# Patient Record
Sex: Female | Born: 1975 | Race: White | Hispanic: No | Marital: Married | Smoking: Former smoker
Health system: Southern US, Community
[De-identification: ages and names within clinical notes are randomized; demographics above are authoritative.]

---

## 2004-12-05 ENCOUNTER — Emergency Department (HOSPITAL_COMMUNITY): Admission: EM | Admit: 2004-12-05 | Discharge: 2004-12-05 | Payer: Self-pay | Admitting: Emergency Medicine

## 2005-04-21 ENCOUNTER — Inpatient Hospital Stay (HOSPITAL_COMMUNITY): Admission: AD | Admit: 2005-04-21 | Discharge: 2005-04-23 | Payer: Self-pay | Admitting: Obstetrics and Gynecology

## 2006-10-27 ENCOUNTER — Ambulatory Visit (HOSPITAL_COMMUNITY): Admission: RE | Admit: 2006-10-27 | Discharge: 2006-10-27 | Payer: Self-pay | Admitting: Obstetrics and Gynecology

## 2006-12-01 ENCOUNTER — Ambulatory Visit (HOSPITAL_COMMUNITY): Admission: RE | Admit: 2006-12-01 | Discharge: 2006-12-01 | Payer: Self-pay | Admitting: Obstetrics and Gynecology

## 2012-01-02 ENCOUNTER — Ambulatory Visit: Payer: Self-pay | Admitting: Internal Medicine

## 2012-01-02 VITALS — BP 108/78 | HR 71 | Temp 98.4°F | Resp 16 | Ht 60.75 in | Wt 213.4 lb

## 2012-01-02 DIAGNOSIS — E669 Obesity, unspecified: Secondary | ICD-10-CM | POA: Insufficient documentation

## 2012-01-02 DIAGNOSIS — L0390 Cellulitis, unspecified: Secondary | ICD-10-CM

## 2012-01-02 MED ORDER — TETANUS-DIPHTH-ACELL PERTUSSIS 5-2.5-18.5 LF-MCG/0.5 IM SUSP
0.5000 mL | Freq: Once | INTRAMUSCULAR | Status: AC
  Administered 2012-01-02: 0.5 mL via INTRAMUSCULAR

## 2012-01-02 MED ORDER — DOXYCYCLINE HYCLATE 100 MG PO TABS: 100.0000 mg | ORAL_TABLET | Freq: Two times a day (BID) | ORAL | Status: AC

## 2012-01-02 NOTE — Patient Instructions (Signed)

## 2012-01-02 NOTE — Progress Notes (Signed)
  Subjective:    Patient ID: Marisa Harrison, female    DOB: [birth year], 36 y.o.   MRN: [id number]  HPI  Marisa Harrison comes in today as a new patient with complaints of red streaking up her left arm.  She noticed two insect bites on her lower forearm on THursday, and she has been applying topical cortisone cream to them.  Yesterday and today her left arm has been more red and swollen.  She has been visiting her mother in ICU at [facility] this week.  She is unsure of her last tetnus shot.    Review of Systems  All other systems reviewed and are negative.       Objective:   Physical Exam  Vitals reviewed. Constitutional: She is oriented to person, place, and time. She appears well-developed and well-nourished.  HENT:  Head: Normocephalic.  Mouth/Throat: Oropharynx is clear and moist. No oropharyngeal exudate.  Neck: Neck supple.  Cardiovascular: Normal rate, regular rhythm and normal heart sounds.   Pulmonary/Chest: Effort normal and breath sounds normal.  Lymphadenopathy:    She has no cervical adenopathy.  Neurological: She is alert and oriented to person, place, and time.  Skin: There is erythema.       Left lower forearm with redness, swelling, tenderness around central small papular lesion c/w cellulitis  small lymph node palpated just above red area on left arm.        Assessment & Plan:  Cellulitis:  Doxycycline 100 mg BID for 10 days.  Tdap updated today.  Return if not improving.

## 2015-07-08 ENCOUNTER — Encounter: Payer: Self-pay | Admitting: Women's Health

## 2015-07-08 ENCOUNTER — Ambulatory Visit (INDEPENDENT_AMBULATORY_CARE_PROVIDER_SITE_OTHER): Payer: BLUE CROSS/BLUE SHIELD | Admitting: Women's Health

## 2015-07-08 VITALS — BP 122/74 | HR 108 | Ht 60.75 in | Wt 206.0 lb

## 2015-07-08 DIAGNOSIS — Z3202 Encounter for pregnancy test, result negative: Secondary | ICD-10-CM

## 2015-07-08 DIAGNOSIS — Z30430 Encounter for insertion of intrauterine contraceptive device: Secondary | ICD-10-CM | POA: Insufficient documentation

## 2015-07-08 DIAGNOSIS — Z30014 Encounter for initial prescription of intrauterine contraceptive device: Secondary | ICD-10-CM

## 2015-07-08 DIAGNOSIS — Z30433 Encounter for removal and reinsertion of intrauterine contraceptive device: Secondary | ICD-10-CM

## 2015-07-08 NOTE — Progress Notes (Signed)
Patient ID: Marisa Harrison, female   DOB: 20-Oct-1975, 39 y.o.   MRN: [id number] [name] is a 39 y.o. year old Caucasian female who presents for removal and replacement of a Mirena IUD. She was given informed consent for removal and reinsertion of her Mirena. Her Mirena was placed 2011, No LMP recorded. Patient is not currently having periods (Reason: IUD)., and her pregnancy test today was neg.   The risks and benefits of the method and placement have been thouroughly reviewed with the patient and all questions were answered.  Specifically the patient is aware of failure rate of 09/998, expulsion of the IUD and of possible perforation.  The patient is aware of irregular bleeding due to the method and understands the incidence of irregular bleeding diminishes with time.  Signed copy of informed consent in chart.   No LMP recorded. Patient is not currently having periods (Reason: IUD). BP 122/74 mmHg  Pulse 108  Ht 5' 0.75" (1.543 m)  Wt 206 lb (93.441 kg)  BMI 39.25 kg/m2 No results found for this or any previous visit (from the past 24 hour(s)).   Appropriate time out taken. A graves speculum was placed in the vagina.  The cervix was visualized, prepped using Betadine. The strings were visible. They were grasped and the Mirena was easily removed. The cervix was then grasped with a single-tooth tenaculum. The uterus was found to be anteroflexed and it sounded to 7 cm.  Mirena IUD placed per manufacturer's recommendations without complications. The strings were trimmed to 3 cm.  The patient tolerated the procedure well.   Sonogram was performed and the proper placement of the IUD was verified via transvaginal u/s.   The patient was given post procedure instructions, including signs and symptoms of infection and to check for the strings after each menses or each month, and refraining from intercourse or anything in the vagina for 3 days.  She was given a Mirnea care card with date IUD  placed, and date IUD to be removed.  She is scheduled for a f/u appointment in 4 weeks for pap & physical  Marge Duncans CNM, [facility] 07/08/2015 2:04 PM

## 2015-07-08 NOTE — Patient Instructions (Signed)
 Nothing in vagina for 3 days (no sex, douching, tampons, etc...)  Check your strings once a month to make sure you can feel them, if you are not able to please let us know  If you develop a fever of 100.4 or more in the next few weeks, or if you develop severe abdominal pain, please let us know  Use a backup method of birth control, such as condoms, for 2 weeks   Levonorgestrel intrauterine device (IUD) What is this medicine? LEVONORGESTREL IUD (LEE voe nor jes trel) is a contraceptive (birth control) device. The device is placed inside the uterus by a healthcare professional. It is used to prevent pregnancy and can also be used to treat heavy bleeding that occurs during your period. Depending on the device, it can be used for 3 to 5 years. This medicine may be used for other purposes; ask your health care provider or pharmacist if you have questions. What should I tell my health care provider before I take this medicine? They need to know if you have any of these conditions: -abnormal Pap smear -cancer of the breast, uterus, or cervix -diabetes -endometritis -genital or pelvic infection now or in the past -have more than one sexual partner or your partner has more than one partner -heart disease -history of an ectopic or tubal pregnancy -immune system problems -IUD in place -liver disease or tumor -problems with blood clots or take blood-thinners -use intravenous drugs -uterus of unusual shape -vaginal bleeding that has not been explained -an unusual or allergic reaction to levonorgestrel, other hormones, silicone, or polyethylene, medicines, foods, dyes, or preservatives -pregnant or trying to get pregnant -breast-feeding How should I use this medicine? This device is placed inside the uterus by a health care professional. Talk to your pediatrician regarding the use of this medicine in children. Special care may be needed. Overdosage: If you think you have taken too much of  this medicine contact a poison control center or emergency room at once. NOTE: This medicine is only for you. Do not share this medicine with others. What if I miss a dose? This does not apply. What may interact with this medicine? Do not take this medicine with any of the following medications: -amprenavir -bosentan -fosamprenavir This medicine may also interact with the following medications: -aprepitant -barbiturate medicines for inducing sleep or treating seizures -bexarotene -griseofulvin -medicines to treat seizures like carbamazepine, ethotoin, felbamate, oxcarbazepine, phenytoin, topiramate -modafinil -pioglitazone -rifabutin -rifampin -rifapentine -some medicines to treat HIV infection like atazanavir, indinavir, lopinavir, nelfinavir, tipranavir, ritonavir -St. John's wort -warfarin This list may not describe all possible interactions. Give your health care provider a list of all the medicines, herbs, non-prescription drugs, or dietary supplements you use. Also tell them if you smoke, drink alcohol, or use illegal drugs. Some items may interact with your medicine. What should I watch for while using this medicine? Visit your doctor or health care professional for regular check ups. See your doctor if you or your partner has sexual contact with others, becomes HIV positive, or gets a sexual transmitted disease. This product does not protect you against HIV infection (AIDS) or other sexually transmitted diseases. You can check the placement of the IUD yourself by reaching up to the top of your vagina with clean fingers to feel the threads. Do not pull on the threads. It is a good habit to check placement after each menstrual period. Call your doctor right away if you feel more of the IUD than just   the threads or if you cannot feel the threads at all. The IUD may come out by itself. You may become pregnant if the device comes out. If you notice that the IUD has come out use a  backup birth control method like condoms and call your health care provider. Using tampons will not change the position of the IUD and are okay to use during your period. What side effects may I notice from receiving this medicine? Side effects that you should report to your doctor or health care professional as soon as possible: -allergic reactions like skin rash, itching or hives, swelling of the face, lips, or tongue -fever, flu-like symptoms -genital sores -high blood pressure -no menstrual period for 6 weeks during use -pain, swelling, warmth in the leg -pelvic pain or tenderness -severe or sudden headache -signs of pregnancy -stomach cramping -sudden shortness of breath -trouble with balance, talking, or walking -unusual vaginal bleeding, discharge -yellowing of the eyes or skin Side effects that usually do not require medical attention (report to your doctor or health care professional if they continue or are bothersome): -acne -breast pain -change in sex drive or performance -changes in weight -cramping, dizziness, or faintness while the device is being inserted -headache -irregular menstrual bleeding within first 3 to 6 months of use -nausea This list may not describe all possible side effects. Call your doctor for medical advice about side effects. You may report side effects to FDA at 1-800-FDA-1088. Where should I keep my medicine? This does not apply. NOTE: This sheet is a summary. It may not cover all possible information. If you have questions about this medicine, talk to your doctor, pharmacist, or health care provider.    2016, Elsevier/Gold Standard. (2011-10-15 13:54:04)  

## 2015-08-06 ENCOUNTER — Other Ambulatory Visit: Payer: BLUE CROSS/BLUE SHIELD | Admitting: Women's Health

## 2015-08-12 ENCOUNTER — Other Ambulatory Visit: Payer: BLUE CROSS/BLUE SHIELD | Admitting: Women's Health

## 2017-04-02 ENCOUNTER — Other Ambulatory Visit: Payer: Self-pay | Admitting: Family Medicine

## 2017-04-02 DIAGNOSIS — Z1231 Encounter for screening mammogram for malignant neoplasm of breast: Secondary | ICD-10-CM

## 2017-04-09 ENCOUNTER — Ambulatory Visit: Payer: Self-pay

## 2017-05-10 ENCOUNTER — Ambulatory Visit: Payer: Self-pay

## 2017-05-11 ENCOUNTER — Ambulatory Visit: Payer: Self-pay

## 2017-05-27 ENCOUNTER — Ambulatory Visit
Admission: RE | Admit: 2017-05-27 | Discharge: 2017-05-27 | Disposition: A | Discharge status: Home / Self Care | Payer: Commercial Managed Care - PPO | Source: Ambulatory Visit | Attending: Family Medicine | Admitting: Family Medicine

## 2017-05-27 DIAGNOSIS — Z1231 Encounter for screening mammogram for malignant neoplasm of breast: Secondary | ICD-10-CM

## 2018-06-08 ENCOUNTER — Other Ambulatory Visit: Payer: Self-pay | Admitting: Family Medicine

## 2018-06-08 DIAGNOSIS — Z1231 Encounter for screening mammogram for malignant neoplasm of breast: Secondary | ICD-10-CM

## 2018-07-06 ENCOUNTER — Ambulatory Visit: Payer: Commercial Managed Care - PPO

## 2020-07-04 ENCOUNTER — Other Ambulatory Visit: Payer: Self-pay

## 2020-07-04 ENCOUNTER — Ambulatory Visit (INDEPENDENT_AMBULATORY_CARE_PROVIDER_SITE_OTHER): Payer: Commercial Managed Care - PPO | Admitting: Advanced Practice Midwife

## 2020-07-04 ENCOUNTER — Encounter: Payer: Self-pay | Admitting: Advanced Practice Midwife

## 2020-07-04 VITALS — BP 113/78 | HR 101 | Ht 61.0 in | Wt 172.0 lb

## 2020-07-04 DIAGNOSIS — Z30432 Encounter for removal of intrauterine contraceptive device: Secondary | ICD-10-CM

## 2020-07-04 MED ORDER — SULFAMETHOXAZOLE-TRIMETHOPRIM 800-160 MG PO TABS: 1.0000 | ORAL_TABLET | Freq: Two times a day (BID) | ORAL | 0 refills | Status: DC

## 2020-07-04 NOTE — Patient Instructions (Addendum)
Skin Abscess  A skin abscess is an infected area on or under your skin that contains a collection of pus and other material. An abscess may also be called a furuncle, carbuncle, or boil. An abscess can occur in or on almost any part of your body. Some abscesses break open (rupture) on their own. Most continue to get worse unless they are treated. The infection can spread deeper into the body and eventually into your blood, which can make you feel ill. Treatment usually involves draining the abscess. What are the causes? An abscess occurs when germs, like bacteria, pass through your skin and cause an infection. This may be caused by:  A scrape or cut on your skin.  A puncture wound through your skin, including a needle injection or insect bite.  Blocked oil or sweat glands.  Blocked and infected hair follicles.  A cyst that forms beneath your skin (sebaceous cyst) and becomes infected. What increases the risk? This condition is more likely to develop in people who:  Have a weak body defense system (immune system).  Have diabetes.  Have dry and irritated skin.  Get frequent injections or use illegal IV drugs.  Have a foreign body in a wound, such as a splinter.  Have problems with their lymph system or veins. What are the signs or symptoms? Symptoms of this condition include:  A painful, firm bump under the skin.  A bump with pus at the top. This may break through the skin and drain. Other symptoms include:  Redness surrounding the abscess site.  Warmth.  Swelling of the lymph nodes (glands) near the abscess.  Tenderness.  A sore on the skin. How is this diagnosed? This condition may be diagnosed based on:  A physical exam.  Your medical history.  A sample of pus. This may be used to find out what is causing the infection.  Blood tests.  Imaging tests, such as an ultrasound, CT scan, or MRI. How is this treated? A small abscess that drains on its own may  not need treatment. Treatment for larger abscesses may include:  Moist heat or heat pack applied to the area several times a day.  A procedure to drain the abscess (incision and drainage).  Antibiotic medicines. For a severe abscess, you may first get antibiotics through an IV and then change to antibiotics by mouth. Follow these instructions at home: Medicines   Take over-the-counter and prescription medicines only as told by your health care provider.  If you were prescribed an antibiotic medicine, take it as told by your health care provider. Do not stop taking the antibiotic even if you start to feel better. Abscess care   If you have an abscess that has not drained, apply heat to the affected area. Use the heat source that your health care provider recommends, such as a moist heat pack or a heating pad. ? Place a towel between your skin and the heat source. ? Leave the heat on for 20-30 minutes. ? Remove the heat if your skin turns bright red. This is especially important if you are unable to feel pain, heat, or cold. You may have a greater risk of getting burned.  Follow instructions from your health care provider about how to take care of your abscess. Make sure you: ? Cover the abscess with a bandage (dressing). ? Change your dressing or gauze as told by your health care provider. ? Wash your hands with soap and water before you change the   dressing or gauze. If soap and water are not available, use hand sanitizer.  Check your abscess every day for signs of a worsening infection. Check for: ? More redness, swelling, or pain. ? More fluid or blood. ? Warmth. ? More pus or a bad smell. General instructions  To avoid spreading the infection: ? Do not share personal care items, towels, or hot tubs with others. ? Avoid making skin contact with other people.  Keep all follow-up visits as told by your health care provider. This is important. Contact a health care provider if  you have:  More redness, swelling, or pain around your abscess.  More fluid or blood coming from your abscess.  Warm skin around your abscess.  More pus or a bad smell coming from your abscess.  A fever.  Muscle aches.  Chills or a general ill feeling. Get help right away if you:  Have severe pain.  See red streaks on your skin spreading away from the abscess. Summary  A skin abscess is an infected area on or under your skin that contains a collection of pus and other material.  A small abscess that drains on its own may not need treatment.  Treatment for larger abscesses may include having a procedure to drain the abscess and taking an antibiotic. This information is not intended to replace advice given to you by your health care provider. Make sure you discuss any questions you have with your health care provider. Document Revised: 01/05/2019 Document Reviewed: 10/28/2017 Elsevier Patient Education  2020 Elsevier Inc.  

## 2020-07-04 NOTE — Progress Notes (Signed)
  Marisa Harrison 44 y.o.  Vitals:   07/04/20 1506  BP: 113/78  Pulse: (!) 101   No past medical history on file. No past surgical history on file. family history includes Cancer in her maternal grandfather; Hypertension in her father; Liver disease in her mother; Stroke in her paternal grandmother. No current outpatient medications on file.    Here for IUD removal.  She had the Mirena IUD placed 5 years ago and would like it removed .Marland Kitchen Her plans for future contraception are VASECTOMY (already done) Also, has a boil on groin area for about a month.  Gets smaller/bigger.  Underwear irritates it. .  A graves speculum was placed, and the strings were visible.  They were grasped with a curved Tresa Endo and the IUD easily removed.   Rx septra DS BID x10

## 2020-07-25 ENCOUNTER — Other Ambulatory Visit: Payer: Self-pay | Admitting: Advanced Practice Midwife

## 2020-12-18 ENCOUNTER — Ambulatory Visit
Admission: RE | Admit: 2020-12-18 | Discharge: 2020-12-18 | Disposition: A | Discharge status: Home / Self Care | Payer: Commercial Managed Care - PPO | Source: Ambulatory Visit | Attending: Family Medicine | Admitting: Family Medicine

## 2020-12-18 ENCOUNTER — Other Ambulatory Visit: Payer: Self-pay

## 2020-12-18 ENCOUNTER — Other Ambulatory Visit: Payer: Self-pay | Admitting: Family Medicine

## 2020-12-18 DIAGNOSIS — Z1231 Encounter for screening mammogram for malignant neoplasm of breast: Secondary | ICD-10-CM

## 2021-01-02 LAB — CYTOLOGY - PAP

## 2021-10-27 ENCOUNTER — Other Ambulatory Visit: Payer: Self-pay

## 2021-10-27 ENCOUNTER — Ambulatory Visit (INDEPENDENT_AMBULATORY_CARE_PROVIDER_SITE_OTHER): Payer: Commercial Managed Care - PPO | Admitting: Adult Health

## 2021-10-27 ENCOUNTER — Encounter: Payer: Self-pay | Admitting: Adult Health

## 2021-10-27 VITALS — BP 109/75 | HR 97 | Ht 62.0 in | Wt 176.0 lb

## 2021-10-27 DIAGNOSIS — Z780 Asymptomatic menopausal state: Secondary | ICD-10-CM | POA: Diagnosis not present

## 2021-10-27 DIAGNOSIS — Z79890 Hormone replacement therapy (postmenopausal): Secondary | ICD-10-CM

## 2021-10-27 MED ORDER — ESTRADIOL 0.5 MG PO TABS: 0.5000 mg | ORAL_TABLET | Freq: Every day | ORAL | 6 refills | Status: DC

## 2021-10-27 MED ORDER — PROGESTERONE 200 MG PO CAPS: ORAL_CAPSULE | ORAL | 6 refills | Status: DC

## 2021-10-27 NOTE — Progress Notes (Signed)
°  Subjective:     Patient ID: Marisa Harrison, female   DOB: 12-09-1975, 46 y.o.   MRN: [id number]  HPI Marisa Harrison is a 46 year year old white female, married, PM, in to discuss HRT,has been on cream. Has never had stroke, MI,DVT, or breast cancer. Had pap 12/2020 was normal with PCP PCP is Bing Matter PA.   Review of Systems Has been on cream HRT for about 9 weeks and hot flashes, vaginal dryness and sleep disturbance better but still anxious and can't lose weight Denies any vaginal bleeding  Reviewed past medical,surgical, social and family history. Reviewed medications and allergies.     Objective:   Physical Exam BP 109/75 (BP Location: Left Arm, Patient Position: Sitting, Cuff Size: Normal)    Pulse 97    Ht 5\' 2"  (1.575 m)    Wt 176 lb (79.8 kg)    BMI 32.19 kg/m     Skin warm and dry. Lungs: clear to ausculation bilaterally. Cardiovascular: regular rate and rhythm.  [place] testosterone 45, FSH 64, estradiol 46, progesterone 0.8 0n 10/20/21. Fall risk is moderate  Upstream - 10/27/21 1410       Pregnancy Intention Screening   Does the patient want to become pregnant in the next year? No    Does the patient's partner want to become pregnant in the next year? No    Would the patient like to discuss contraceptive options today? No      Contraception Wrap Up   Current Method No Method - Other Reason   postmenopausal   End Method No Method - Other Reason   postmenopausal   Contraception Counseling Provided No             Assessment:     1. Hormone replacement therapy (HRT) Will rx estrace 0.5 mg 1 daily po  and Prometrium 200 mg 1 daily at bedtime Meds ordered this encounter  Medications   progesterone (PROMETRIUM) 200 MG capsule    Sig: Take 1 at bedtime daily    Dispense:  30 capsule    Refill:  6    Order Specific Question:   Supervising Provider    Answer:   Tania Ade H [[id number]]   estradiol (ESTRACE) 0.5 MG tablet    Sig: Take 1  tablet (0.5 mg total) by mouth daily.    Dispense:  30 tablet    Refill:  6    Order Specific Question:   Supervising Provider    Answer:   Florian Buff [[id number]]    Send Mychart message in about 8 weeks on how it is going  2. Postmenopause    Plan:     Follow up in 6 months or sooner if needed for ROS and refills

## 2021-12-24 ENCOUNTER — Other Ambulatory Visit: Payer: Self-pay | Admitting: Adult Health

## 2021-12-24 MED ORDER — ESTRADIOL 1 MG PO TABS: 1.0000 mg | ORAL_TABLET | Freq: Every day | ORAL | 3 refills | Status: DC

## 2021-12-24 NOTE — Progress Notes (Signed)
Will increase estrace to 1 mg ?

## 2022-01-30 ENCOUNTER — Other Ambulatory Visit: Payer: Self-pay | Admitting: Family Medicine

## 2022-01-30 DIAGNOSIS — Z1231 Encounter for screening mammogram for malignant neoplasm of breast: Secondary | ICD-10-CM

## 2022-02-06 ENCOUNTER — Ambulatory Visit
Admission: RE | Admit: 2022-02-06 | Discharge: 2022-02-06 | Disposition: A | Discharge status: Home / Self Care | Payer: Commercial Managed Care - PPO | Source: Ambulatory Visit | Attending: Family Medicine | Admitting: Family Medicine

## 2022-02-06 DIAGNOSIS — Z1231 Encounter for screening mammogram for malignant neoplasm of breast: Secondary | ICD-10-CM

## 2022-02-13 ENCOUNTER — Other Ambulatory Visit: Payer: Self-pay | Admitting: *Deleted

## 2022-02-13 MED ORDER — PROGESTERONE 200 MG PO CAPS: ORAL_CAPSULE | ORAL | 3 refills | Status: DC

## 2022-02-13 MED ORDER — ESTRADIOL 1 MG PO TABS: 1.0000 mg | ORAL_TABLET | Freq: Every day | ORAL | 3 refills | Status: DC

## 2022-05-29 ENCOUNTER — Telehealth: Payer: Self-pay | Admitting: Adult Health

## 2022-05-29 MED ORDER — ESTRADIOL 2 MG PO TABS: 2.0000 mg | ORAL_TABLET | Freq: Every day | ORAL | 3 refills | Status: DC

## 2022-05-29 NOTE — Telephone Encounter (Signed)
Glad that estrace 2 mg is working, will send in Rx [facility]

## 2022-06-02 ENCOUNTER — Other Ambulatory Visit: Payer: Self-pay | Admitting: Adult Health

## 2022-06-02 MED ORDER — BUSPIRONE HCL 5 MG PO TABS: 5.0000 mg | ORAL_TABLET | Freq: Three times a day (TID) | ORAL | 2 refills | Status: DC

## 2022-06-02 NOTE — Progress Notes (Signed)
Will rx buspar

## 2022-11-03 IMAGING — MG MM DIGITAL SCREENING BILAT W/ TOMO AND CAD
8 series · 8 of 24 positions shown · non-contrast
Comparison: Previous exam(s).

CLINICAL DATA: Screening.

EXAM:
DIGITAL SCREENING BILATERAL MAMMOGRAM WITH TOMOSYNTHESIS AND CAD
TECHNIQUE: Bilateral screening digital craniocaudal and mediolateral oblique
mammograms were obtained. Bilateral screening digital breast
tomosynthesis was performed. The images were evaluated with
computer-aided detection.

[R MLO synth-2D]
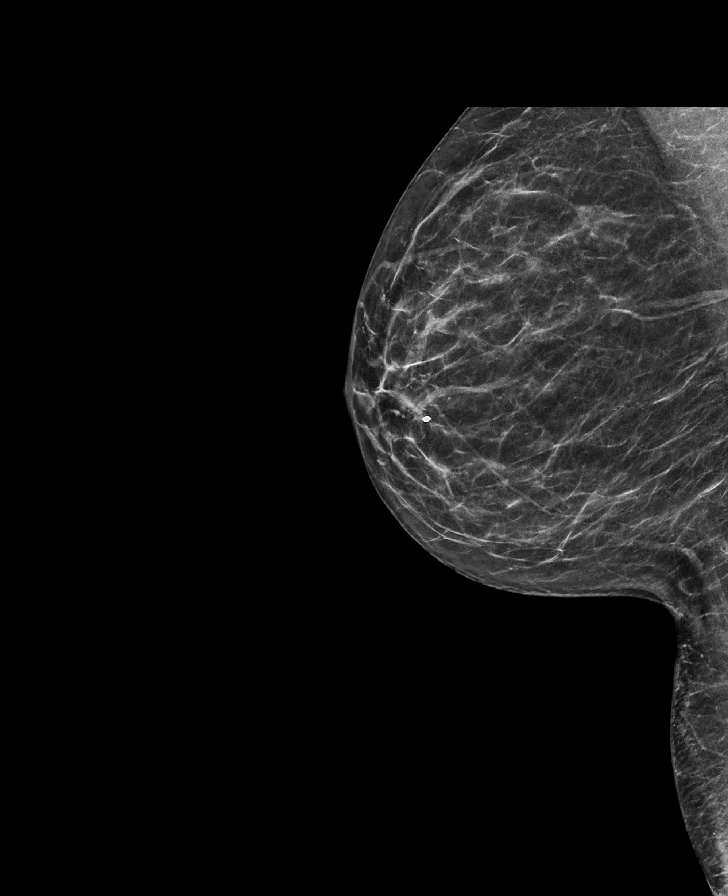

[L CC synth-2D]
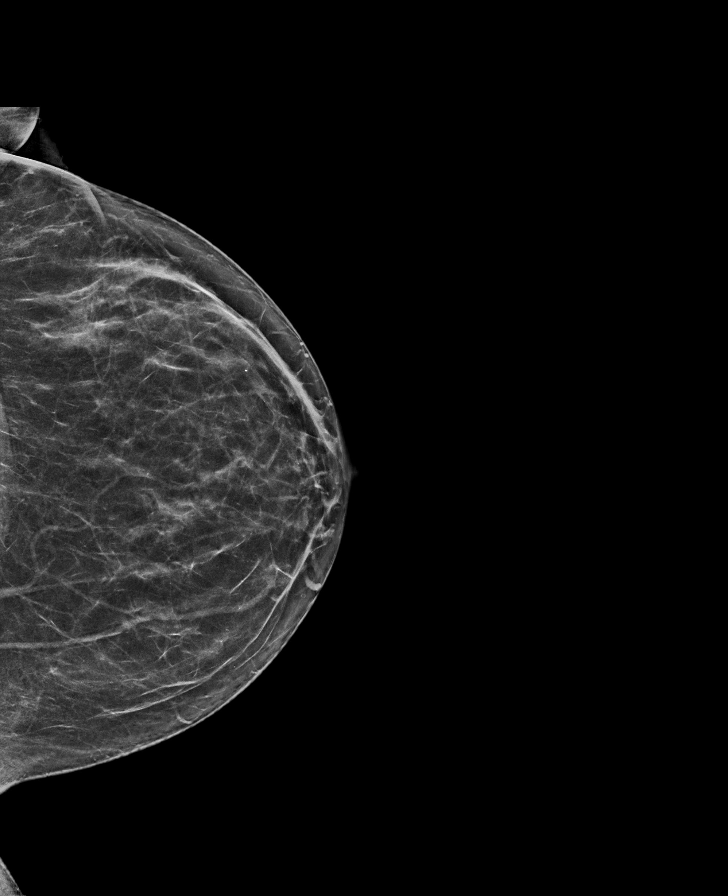

[L MLO synth-2D]
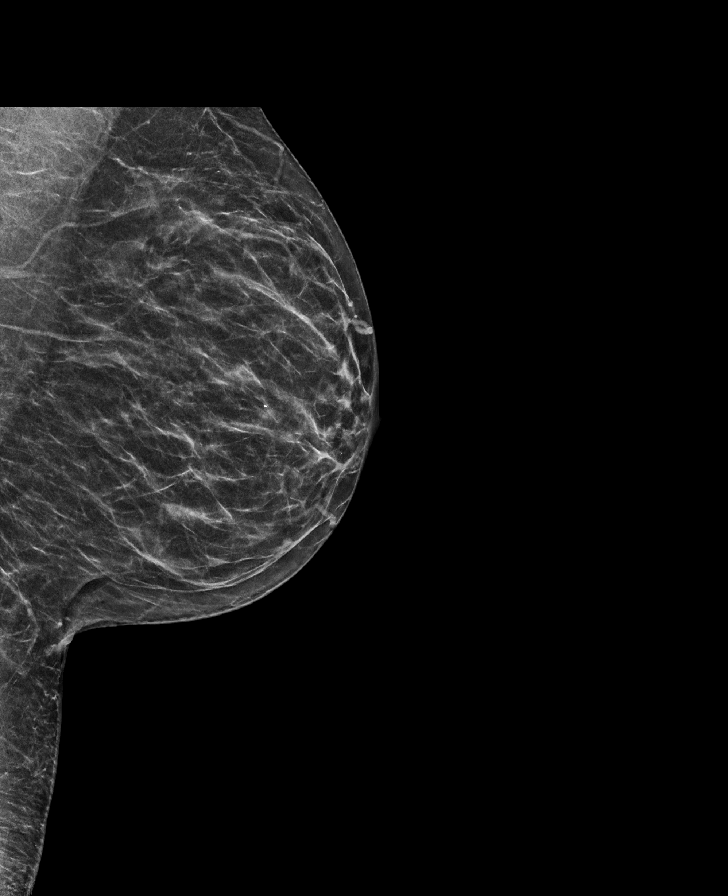

[R CC synth-2D]
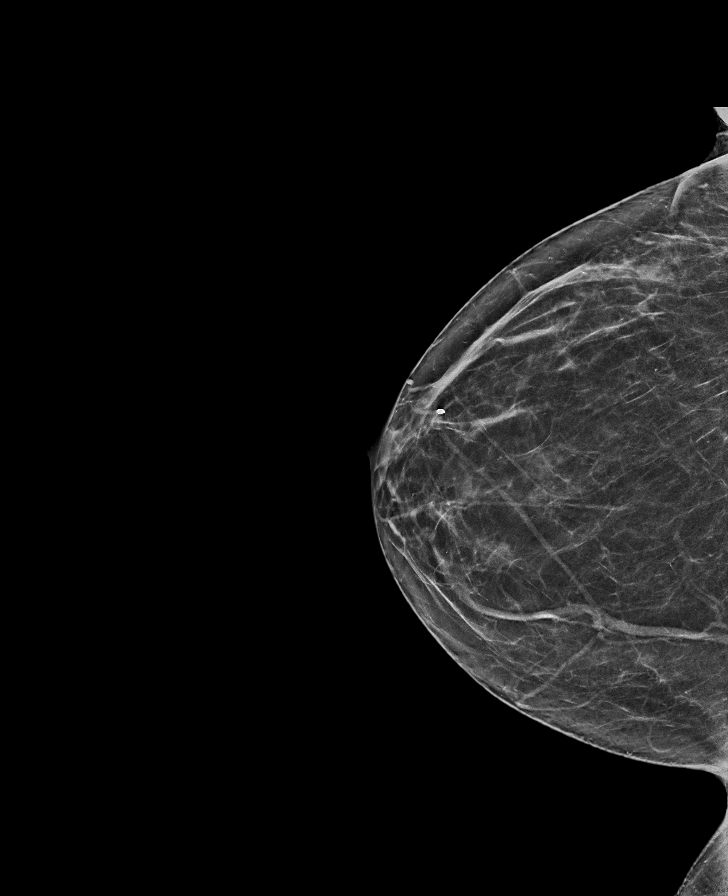

[R MLO tomo · tomo slice 30/59.0]
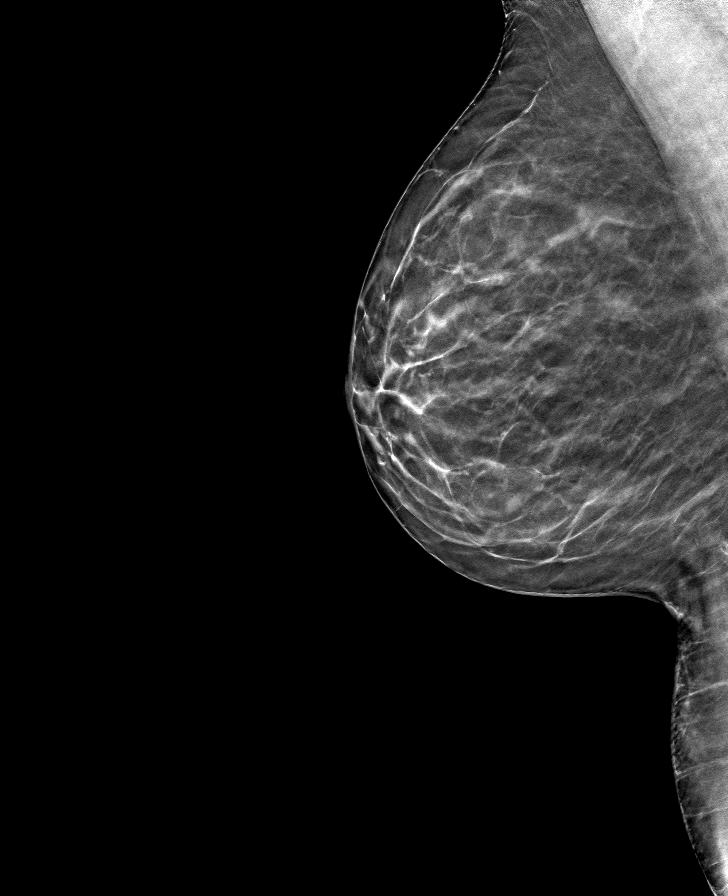

[L MLO tomo · tomo slice 29/58.0]
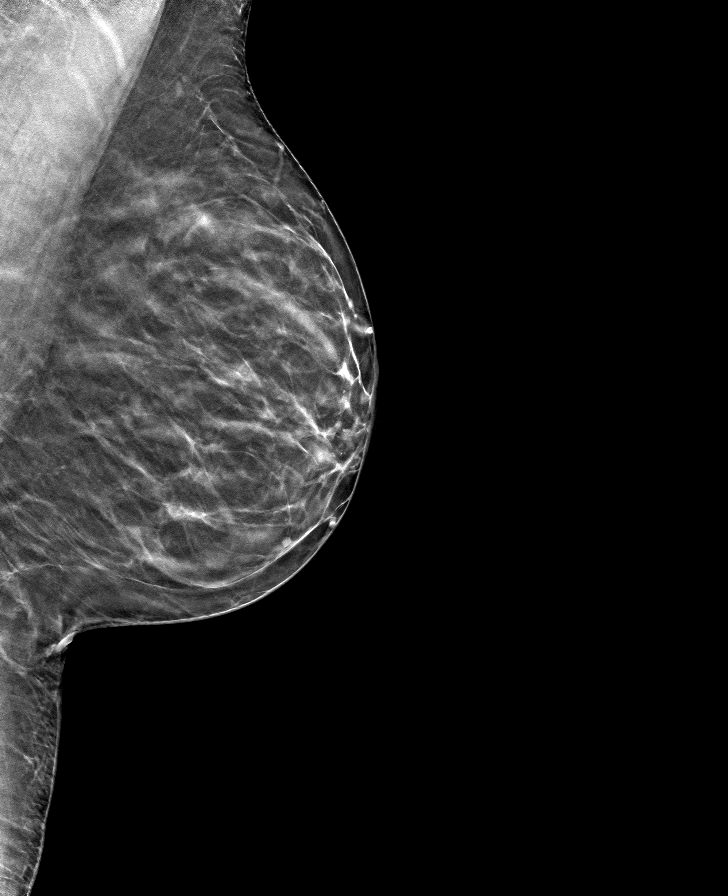

[L CC tomo · tomo slice 32/63.0]
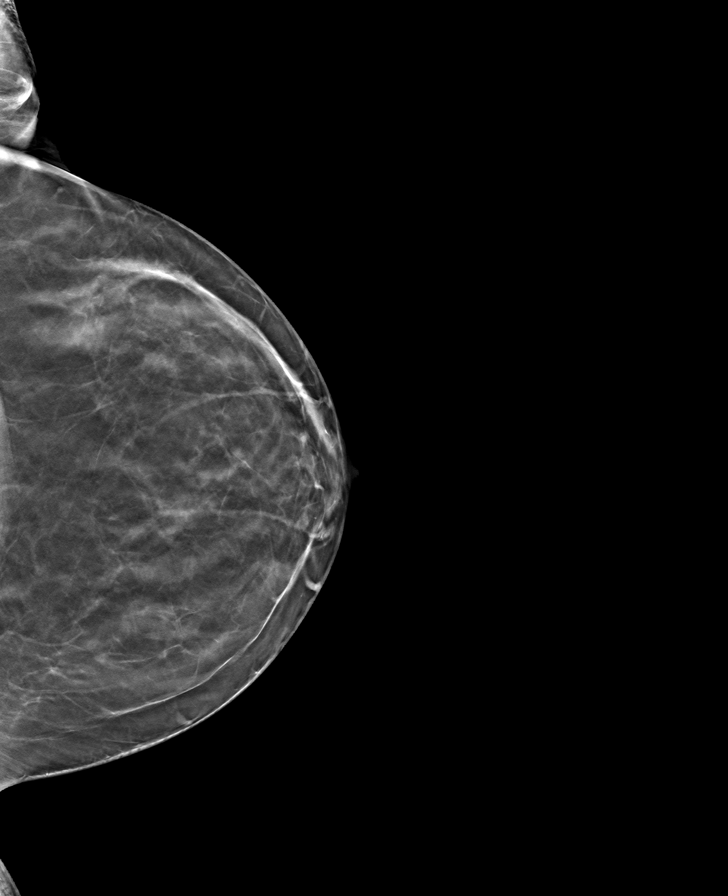

[R CC tomo · tomo slice 32/63.0]
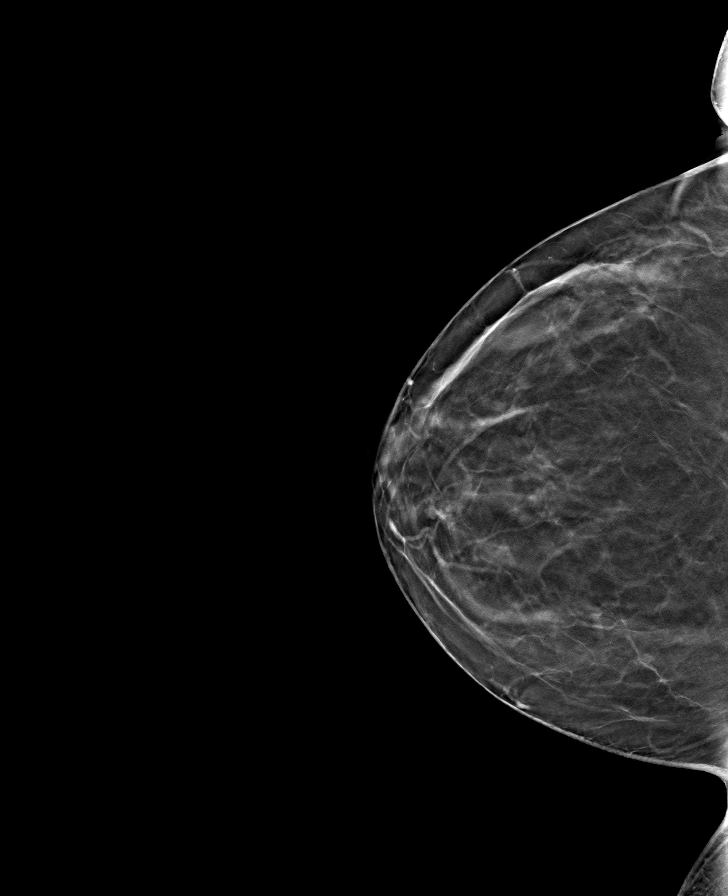

[8 of 24 positions shown; findings below may reference images not displayed]

ACR Breast Density Category b: There are scattered areas of
fibroglandular density.
FINDINGS: There are no findings suspicious for malignancy.
IMPRESSION: No mammographic evidence of malignancy. A result letter of this
screening mammogram will be mailed directly to the patient.

RECOMMENDATION:
Screening mammogram in one year. (Code:51-O-LD2)

BI-RADS CATEGORY  1: Negative.

## 2022-12-09 ENCOUNTER — Encounter: Payer: Self-pay | Admitting: Adult Health

## 2022-12-09 ENCOUNTER — Ambulatory Visit (INDEPENDENT_AMBULATORY_CARE_PROVIDER_SITE_OTHER): Payer: Commercial Managed Care - PPO | Admitting: Adult Health

## 2022-12-09 VITALS — BP 107/68 | HR 79 | Ht 61.0 in | Wt 174.0 lb

## 2022-12-09 DIAGNOSIS — Z79890 Hormone replacement therapy (postmenopausal): Secondary | ICD-10-CM

## 2022-12-09 DIAGNOSIS — Z780 Asymptomatic menopausal state: Secondary | ICD-10-CM | POA: Diagnosis not present

## 2022-12-09 MED ORDER — ESTRADIOL 0.1 MG/24HR TD PTWK: 0.1000 mg | MEDICATED_PATCH | TRANSDERMAL | 4 refills | Status: DC

## 2022-12-09 NOTE — Progress Notes (Signed)
  Subjective:     Patient ID: Marisa Harrison, female   DOB: Oct 31, 1975, 47 y.o.   MRN: [id number]  HPI Marisa Harrison is a 47 year old white female, married, PM in wanting to discuss changing to hormone patch.   Last pap was negative 01/02/21  PCP is K Deatra Ina PA.  Review of Systems Denies any vaginal bleeding Vaginal dryness, (can try replens or luvena first) Reviewed past medical,surgical, social and family history. Reviewed medications and allergies.     Objective:   Physical Exam BP 107/68 (BP Location: Left Arm, Patient Position: Sitting, Cuff Size: Normal)   Pulse 79   Ht 5\' 1"  (1.549 m)   Wt 174 lb (78.9 kg)   BMI 32.88 kg/m     Skin warm and dry.  Lungs: clear to ausculation bilaterally. Cardiovascular: regular rate and rhythm.  Fall risk is low  Upstream - 12/09/22 1403       Pregnancy Intention Screening   Does the patient want to become pregnant in the next year? N/A    Does the patient's partner want to become pregnant in the next year? N/A    Would the patient like to discuss contraceptive options today? N/A      Contraception Wrap Up   Current Method No Method - Other Reason   postmenopausal   Reason for No Current Contraceptive Method at Intake ([facility] Only) Other    End Method No Method - Other Reason   postmenopausal   Contraception Counseling Provided No             Assessment:     1. Hormone replacement therapy (HRT) Will stop estrace po and try climara patch and continue Prometrium at bedtime Meds ordered this encounter  Medications   estradiol (CLIMARA - DOSED IN MG/24 HR) 0.1 mg/24hr patch    Sig: Place 1 patch (0.1 mg total) onto the skin every 7 (seven) days.    Dispense:  4 patch    Refill:  4    Order Specific Question:   Supervising Provider    Answer:   Elonda Husky, LUTHER H [[id number]]     2. Postmenopause Denies any bleeding     Plan:     Follow up in 3 months for ROS

## 2023-01-26 ENCOUNTER — Other Ambulatory Visit: Payer: Self-pay | Admitting: Adult Health

## 2023-03-01 ENCOUNTER — Other Ambulatory Visit: Payer: Self-pay | Admitting: Adult Health

## 2023-03-01 DIAGNOSIS — Z1231 Encounter for screening mammogram for malignant neoplasm of breast: Secondary | ICD-10-CM

## 2023-03-11 ENCOUNTER — Ambulatory Visit: Payer: Commercial Managed Care - PPO | Admitting: Adult Health

## 2023-03-11 ENCOUNTER — Ambulatory Visit (INDEPENDENT_AMBULATORY_CARE_PROVIDER_SITE_OTHER): Payer: Commercial Managed Care - PPO | Admitting: Adult Health

## 2023-03-11 ENCOUNTER — Encounter: Payer: Self-pay | Admitting: Adult Health

## 2023-03-11 VITALS — BP 101/67 | HR 77 | Ht 61.0 in | Wt 173.5 lb

## 2023-03-11 DIAGNOSIS — Z780 Asymptomatic menopausal state: Secondary | ICD-10-CM | POA: Diagnosis not present

## 2023-03-11 DIAGNOSIS — Z79890 Hormone replacement therapy (postmenopausal): Secondary | ICD-10-CM

## 2023-03-11 MED ORDER — PROGESTERONE 200 MG PO CAPS: ORAL_CAPSULE | ORAL | 3 refills | Status: DC

## 2023-03-11 MED ORDER — ESTRADIOL 0.1 MG/24HR TD PTWK: 0.1000 mg | MEDICATED_PATCH | TRANSDERMAL | 3 refills | Status: DC

## 2023-03-11 NOTE — Progress Notes (Signed)
  Subjective:     Patient ID: Marisa Harrison, female   DOB: 1976-04-17, 47 y.o.   MRN: [id number]  HPI Marisa Harrison is a 47 year old white female, married, PM back in follow up on trying the hormone patch and doing good, more vaginal moisture and sex is better.  Last pap was negative 2022  PCP is K Arlyce Dice PA,  Review of Systems More vaginal moisture Sex better No hot flashes Has some brown discharge at times Reviewed past medical,surgical, social and family history. Reviewed medications and allergies.     Objective:   Physical Exam BP 101/67 (BP Location: Left Arm, Patient Position: Sitting, Cuff Size: Normal)   Pulse 77   Ht 5\' 1"  (1.549 m)   Wt 173 lb 8 oz (78.7 kg)   BMI 32.78 kg/m     Skin warm and dry.  Lungs: clear to ausculation bilaterally. Cardiovascular: regular rate and rhythm.  Fall risk is low ;  Upstream - 03/11/23 1415       Pregnancy Intention Screening   Does the patient want to become pregnant in the next year? N/A    Does the patient's partner want to become pregnant in the next year? N/A    Would the patient like to discuss contraceptive options today? N/A      Contraception Wrap Up   Current Method No Method - Other Reason   postmenopausal   Reason for No Current Contraceptive Method at Intake ([facility] Only) Other    End Method No Method - Other Reason   postmenopausal   Contraception Counseling Provided No             Assessment:     1. Hormone replacement therapy (HRT) Will continue patch and Prometrium  Meds ordered this encounter  Medications   progesterone (PROMETRIUM) 200 MG capsule    Sig: TAKE 1 CAPSULE DAILY AT BEDTIME    Dispense:  90 capsule    Refill:  3    Order Specific Question:   Supervising Provider    Answer:   Duane Lope H [[id number]]   estradiol (CLIMARA - DOSED IN MG/24 HR) 0.1 mg/24hr patch    Sig: Place 1 patch (0.1 mg total) onto the skin every 7 (seven) days.    Dispense:  12 patch    Refill:  3    Order Specific  Question:   Supervising Provider    Answer:   Despina Hidden, LUTHER H [[id number]]     2. Postmenopause     Plan:      Return in 9 months for pap and physical  with me

## 2023-03-19 ENCOUNTER — Ambulatory Visit: Payer: Commercial Managed Care - PPO

## 2023-04-16 ENCOUNTER — Ambulatory Visit: Payer: Commercial Managed Care - PPO

## 2023-04-27 ENCOUNTER — Other Ambulatory Visit: Payer: Self-pay | Admitting: Adult Health

## 2023-06-22 ENCOUNTER — Ambulatory Visit
Admission: RE | Admit: 2023-06-22 | Discharge: 2023-06-22 | Disposition: A | Discharge status: Home / Self Care | Payer: Commercial Managed Care - PPO | Source: Ambulatory Visit | Attending: Adult Health | Admitting: Adult Health

## 2023-06-22 DIAGNOSIS — Z1231 Encounter for screening mammogram for malignant neoplasm of breast: Secondary | ICD-10-CM

## 2023-06-29 ENCOUNTER — Other Ambulatory Visit: Payer: Self-pay | Admitting: Adult Health

## 2023-06-29 MED ORDER — ESTRADIOL 0.1 MG/24HR TD PTTW: 1.0000 | MEDICATED_PATCH | TRANSDERMAL | 3 refills | Status: DC

## 2023-06-29 NOTE — Progress Notes (Signed)
Will try vivelle dot.

## 2023-07-02 ENCOUNTER — Other Ambulatory Visit: Payer: Self-pay | Admitting: Adult Health

## 2023-07-02 MED ORDER — CLOTRIMAZOLE-BETAMETHASONE 1-0.05 % EX CREA: 1.0000 | TOPICAL_CREAM | Freq: Two times a day (BID) | CUTANEOUS | 0 refills | Status: DC

## 2023-08-02 ENCOUNTER — Other Ambulatory Visit: Payer: Self-pay | Admitting: Adult Health

## 2023-08-02 MED ORDER — MUPIROCIN 2 % EX OINT: 1.0000 | TOPICAL_OINTMENT | Freq: Two times a day (BID) | CUTANEOUS | 1 refills | Status: DC

## 2023-08-02 NOTE — Progress Notes (Signed)
Rx Bactroban to [facility]

## 2023-09-27 ENCOUNTER — Other Ambulatory Visit: Payer: Self-pay | Admitting: Adult Health

## 2023-09-27 MED ORDER — ESTRADIOL 0.1 MG/GM VA CREA: TOPICAL_CREAM | VAGINAL | 2 refills | Status: DC

## 2023-09-27 MED ORDER — ESTRADIOL 0.1 MG/24HR TD PTTW: 1.0000 | MEDICATED_PATCH | TRANSDERMAL | 3 refills | Status: DC

## 2023-09-27 MED ORDER — PROGESTERONE 200 MG PO CAPS: ORAL_CAPSULE | ORAL | 3 refills | Status: DC

## 2023-09-27 NOTE — Progress Notes (Signed)
Rx estrace vaginal cream, and refilled Prometrium

## 2023-09-27 NOTE — Progress Notes (Signed)
[facility]

## 2023-09-28 ENCOUNTER — Other Ambulatory Visit: Payer: Self-pay | Admitting: Adult Health

## 2023-09-28 MED ORDER — PROGESTERONE 200 MG PO CAPS: ORAL_CAPSULE | ORAL | 3 refills | Status: DC

## 2023-09-28 MED ORDER — ESTRADIOL 0.1 MG/24HR TD PTTW: 1.0000 | MEDICATED_PATCH | TRANSDERMAL | 3 refills | Status: DC

## 2023-09-28 NOTE — Progress Notes (Signed)
[facility] and Prom[facility]rium

## 2023-10-18 ENCOUNTER — Other Ambulatory Visit: Payer: Self-pay | Admitting: Adult Health

## 2024-01-05 ENCOUNTER — Other Ambulatory Visit (HOSPITAL_COMMUNITY)
Admission: RE | Admit: 2024-01-05 | Discharge: 2024-01-05 | Disposition: A | Discharge status: Home / Self Care | Source: Ambulatory Visit | Attending: Adult Health | Admitting: Adult Health

## 2024-01-05 ENCOUNTER — Encounter: Payer: Self-pay | Admitting: Adult Health

## 2024-01-05 ENCOUNTER — Ambulatory Visit: Admitting: Adult Health

## 2024-01-05 VITALS — BP 114/78 | HR 69 | Ht 61.0 in | Wt 175.0 lb

## 2024-01-05 DIAGNOSIS — Z79890 Hormone replacement therapy (postmenopausal): Secondary | ICD-10-CM

## 2024-01-05 DIAGNOSIS — L732 Hidradenitis suppurativa: Secondary | ICD-10-CM | POA: Diagnosis not present

## 2024-01-05 DIAGNOSIS — Z01419 Encounter for gynecological examination (general) (routine) without abnormal findings: Secondary | ICD-10-CM | POA: Insufficient documentation

## 2024-01-05 DIAGNOSIS — Z1331 Encounter for screening for depression: Secondary | ICD-10-CM | POA: Diagnosis not present

## 2024-01-05 DIAGNOSIS — Z1212 Encounter for screening for malignant neoplasm of rectum: Secondary | ICD-10-CM

## 2024-01-05 DIAGNOSIS — Z1211 Encounter for screening for malignant neoplasm of colon: Secondary | ICD-10-CM | POA: Insufficient documentation

## 2024-01-05 DIAGNOSIS — Z780 Asymptomatic menopausal state: Secondary | ICD-10-CM

## 2024-01-05 LAB — HEMOCCULT GUIAC POC 1CARD (OFFICE): Fecal Occult Blood, POC: NEGATIVE

## 2024-01-05 MED ORDER — HYDROXYZINE PAMOATE 25 MG PO CAPS: 25.0000 mg | ORAL_CAPSULE | Freq: Three times a day (TID) | ORAL | 1 refills | Status: AC | PRN

## 2024-01-05 MED ORDER — ESTRADIOL 0.1 MG/GM VA CREA: TOPICAL_CREAM | VAGINAL | 2 refills | Status: DC

## 2024-01-05 MED ORDER — PROGESTERONE 200 MG PO CAPS: ORAL_CAPSULE | ORAL | 3 refills | Status: AC

## 2024-01-05 MED ORDER — ESTRADIOL 0.1 MG/24HR TD PTTW: 1.0000 | MEDICATED_PATCH | TRANSDERMAL | 3 refills | Status: DC

## 2024-01-05 NOTE — Progress Notes (Signed)
 Patient ID: Marisa Harrison, female   DOB: [birth year], 48 y.o.   MRN: [id number] History of Present Illness: Marisa Harrison is a 48 year old white female,married, PM in for a well woman gyn exam and pap. Doing good on HRT.  PCP is Terri Piedra PA   Current Medications, Allergies, Past Medical History, Past Surgical History, Family History and Social History were reviewed in [name] record.     Review of Systems: Patient denies any headaches, hearing loss, fatigue, blurred vision, shortness of breath, chest pain, abdominal pain, problems with bowel movements, urination, or intercourse.(Sex better with estrace vaginal cream) No joint pain or mood swings.  Denies any vaginal bleeding Has HS flare in left groin more frequently Has plane anxiety, going to [place] in August, request meds    Physical Exam:BP 114/78 (BP Location: Left Arm, Patient Position: Sitting, Cuff Size: Normal)   Pulse 69   Ht 5\' 1"  (1.549 m)   Wt 175 lb (79.4 kg)   BMI 33.07 kg/m   General:  Well developed, well nourished, no acute distress Skin:  Warm and dry Neck:  Midline trachea, normal thyroid, good ROM, no lymphadenopathy Lungs; Clear to auscultation bilaterally Breast:  No dominant palpable mass, retraction, or nipple discharge Cardiovascular: Regular rate and rhythm Abdomen:  Soft, non tender, no hepatosplenomegaly Pelvic:  External genitalia is normal in appearance, has scarring and thickness left groin, has had HS.  The vagina is normal in appearance. Urethra has no lesions or masses. The cervix is smooth, pap with HR HPV genotyping performed.  Uterus is felt to be normal size, shape, and contour.  No adnexal masses or tenderness noted.Bladder is non tender, no masses felt. Rectal: Good sphincter tone, no polyps, or hemorrhoids felt.  Hemoccult negative. Extremities/musculoskeletal:  No swelling or varicosities noted, no clubbing or cyanosis Psych:  No mood changes, alert and  cooperative,seems happy AA is 1 Fall risk is low    01/05/2024   11:27 AM  Depression screen PHQ 2/9  Decreased Interest 0  Down, Depressed, Hopeless 0  PHQ - 2 Score 0  Altered sleeping 0  Tired, decreased energy 0  Change in appetite 0  Feeling bad or failure about yourself  0  Trouble concentrating 0  Moving slowly or fidgety/restless 0  Suicidal thoughts 0  PHQ-9 Score 0       01/05/2024   11:27 AM  GAD 7 : Generalized Anxiety Score  Nervous, Anxious, on Edge 0  Control/stop worrying 0  Worry too much - different things 0  Trouble relaxing 0  Restless 0  Easily annoyed or irritable 0  Afraid - awful might happen 0  Total GAD 7 Score 0      Upstream - 01/05/24 1131       Pregnancy Intention Screening   Does the patient want to become pregnant in the next year? N/A    Does the patient's partner want to become pregnant in the next year? N/A    Would the patient like to discuss contraceptive options today? N/A      Contraception Wrap Up   Current Method No Method - Other Reason   PM   End Method No Method - Other Reason   PM   Contraception Counseling Provided No             Examination chaperoned by [name] LPN  Impression and plan: 1. Encounter for gynecological examination with Papanicolaou smear of cervix (Primary) Pap sent Pap in 3  years if normal Physical in 1 year Has labs at work, will send me a copy Mammogram was negative 06/22/23 Will rx vistaril for plane anxiety   - Cytology - PAP( [place])  2. Encounter for screening fecal occult blood testing Hemoccult was negative  - POCT occult blood stool  3. Hormone replacement therapy (HRT) Happy with HRT Meds ordered this encounter  Medications   estradiol (VIVELLE-DOT) 0.1 MG/24HR patch    Sig: Place 1 patch (0.1 mg total) onto the skin 2 (two) times a week.    Dispense:  24 patch    Refill:  3    Supervising Provider:   Duane Lope H [[id number]]   progesterone (PROMETRIUM) 200 MG  capsule    Sig: TAKE 1 CAPSULE DAILY AT BEDTIME    Dispense:  90 capsule    Refill:  3    Supervising Provider:   Duane Lope H [[id number]]   estradiol (ESTRACE VAGINAL) 0.1 MG/GM vaginal cream    Sig: Use 0.5 gm in vagina 2-3 x weekly    Dispense:  42.5 g    Refill:  2    Supervising Provider:   Duane Lope H [[id number]]   hydrOXYzine (VISTARIL) 25 MG capsule    Sig: Take 1 capsule (25 mg total) by mouth 3 (three) times daily as needed.    Dispense:  30 capsule    Refill:  1    Supervising Provider:   Despina Hidden, LUTHER H [[id number]]     4. Screening for colorectal cancer Order cologuard - Cologuard  5. Postmenopause Denies any vaginal bleeding   6. Hidradenitis Has area left groin that flares, make appt with Dr Despina Hidden for excision when works with your schedule

## 2024-01-06 ENCOUNTER — Telehealth: Payer: Self-pay | Admitting: *Deleted

## 2024-01-06 LAB — CYTOLOGY - PAP
Comment: NEGATIVE
Diagnosis: NEGATIVE
High risk HPV: NEGATIVE

## 2024-01-06 MED ORDER — ESTRADIOL 0.1 MG/24HR TD PTTW: 1.0000 | MEDICATED_PATCH | TRANSDERMAL | 3 refills | Status: DC

## 2024-01-06 NOTE — Telephone Encounter (Signed)
 Pts insurance prefers that she get a 90 day supply on medicine. They are requesting 90 day rx for estradiol patch. Will send to [facility] to send in.

## 2024-01-06 NOTE — Telephone Encounter (Signed)
 Resent patch refills

## 2024-02-08 LAB — COLOGUARD: COLOGUARD: NEGATIVE

## 2024-02-09 ENCOUNTER — Ambulatory Visit: Payer: Self-pay | Admitting: Adult Health

## 2024-04-10 ENCOUNTER — Ambulatory Visit: Admitting: Obstetrics & Gynecology

## 2024-04-10 ENCOUNTER — Encounter: Payer: Self-pay | Admitting: Obstetrics & Gynecology

## 2024-04-10 ENCOUNTER — Other Ambulatory Visit (HOSPITAL_COMMUNITY)
Admission: RE | Admit: 2024-04-10 | Discharge: 2024-04-10 | Disposition: A | Discharge status: Home / Self Care | Source: Ambulatory Visit | Attending: Obstetrics & Gynecology | Admitting: Obstetrics & Gynecology

## 2024-04-10 VITALS — BP 103/67 | HR 80

## 2024-04-10 DIAGNOSIS — L089 Local infection of the skin and subcutaneous tissue, unspecified: Secondary | ICD-10-CM

## 2024-04-10 DIAGNOSIS — L729 Follicular cyst of the skin and subcutaneous tissue, unspecified: Secondary | ICD-10-CM | POA: Insufficient documentation

## 2024-04-10 NOTE — Addendum Note (Signed)
 Addended by: BERNARDO ALAN CROME on: 04/10/2024 04:43 PM   Modules accepted: Orders

## 2024-04-10 NOTE — Progress Notes (Signed)
 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 female who presents for minor skin surgery.  The patient understands all risks, benefits, indications, potential complications, and alternatives, and freely consents for the procedure.  The patient also understands the option of performing no surgery, the risk for scarring, and the technique of the procedure.  ANESTHESIA:  Local.  TECHNIQUE:  After informed consent was obtained, and after the skin was prepped and draped, 2% lidocaine without epinephrine for anesthetic was injected around and underneath the site.   elliptical excision in total was performed.   4 3-0 ethilon sutures with FS1 were placed with good approximation and hemostasis wound care instructions were provided.   Marisa Harrison tolerated the procedure well and without complications.  Schedule for jennifer to remove sutures on Fridy 04/21/24  The patient will be alert for any signs of cutaneous infection and will follow up as instructed.

## 2024-04-12 LAB — SURGICAL PATHOLOGY

## 2024-04-14 ENCOUNTER — Other Ambulatory Visit: Payer: Self-pay | Admitting: Adult Health

## 2024-04-14 MED ORDER — ESTRADIOL 0.1 MG/24HR TD PTTW: 1.0000 | MEDICATED_PATCH | TRANSDERMAL | 0 refills | Status: AC

## 2024-04-14 NOTE — Progress Notes (Signed)
 Refill vivelle  til mailorder come in

## 2024-04-21 ENCOUNTER — Ambulatory Visit: Admitting: Obstetrics & Gynecology

## 2024-04-24 ENCOUNTER — Other Ambulatory Visit: Payer: Self-pay | Admitting: *Deleted

## 2024-04-25 MED ORDER — ESTRADIOL 0.1 MG/GM VA CREA
TOPICAL_CREAM | VAGINAL | 2 refills | Status: AC
Start: 2024-04-25 — End: ?

## 2024-05-09 ENCOUNTER — Other Ambulatory Visit: Payer: Self-pay | Admitting: Adult Health

## 2024-06-14 ENCOUNTER — Other Ambulatory Visit: Payer: Self-pay | Admitting: Adult Health

## 2024-08-15 ENCOUNTER — Other Ambulatory Visit: Payer: Self-pay | Admitting: Adult Health

## 2024-08-15 DIAGNOSIS — Z1231 Encounter for screening mammogram for malignant neoplasm of breast: Secondary | ICD-10-CM

## 2024-09-04 ENCOUNTER — Ambulatory Visit
Admission: RE | Admit: 2024-09-04 | Discharge: 2024-09-04 | Disposition: A | Source: Ambulatory Visit | Attending: Adult Health | Admitting: Adult Health

## 2024-09-04 DIAGNOSIS — Z1231 Encounter for screening mammogram for malignant neoplasm of breast: Secondary | ICD-10-CM

## 2024-09-11 ENCOUNTER — Ambulatory Visit: Payer: Self-pay | Admitting: Adult Health

## 2024-09-12 ENCOUNTER — Ambulatory Visit
